# Patient Record
Sex: Female | Born: 1935 | Race: Black or African American | Hispanic: No
Health system: Southern US, Community
[De-identification: ages and names within clinical notes are randomized; demographics above are authoritative.]

---

## 2007-09-28 ENCOUNTER — Inpatient Hospital Stay: Payer: Self-pay | Admitting: Internal Medicine

## 2007-09-28 ENCOUNTER — Other Ambulatory Visit: Payer: Self-pay

## 2007-10-04 ENCOUNTER — Other Ambulatory Visit: Payer: Self-pay

## 2009-02-03 IMAGING — CR DG CHEST 1V PORT
1 series · 1 of 1 positions shown · non-contrast
Comparison: none

REASON FOR EXAM: Shortness of breath
COMMENTS:   LMP: Post-Menopausal

PROCEDURE:     DXR - DXR PORTABLE CHEST SINGLE VIEW  - September 28, 2007  [DATE]
RESULT:     The lungs are clear. The cardiovascular structures are
unremarkable.

[view not recorded]
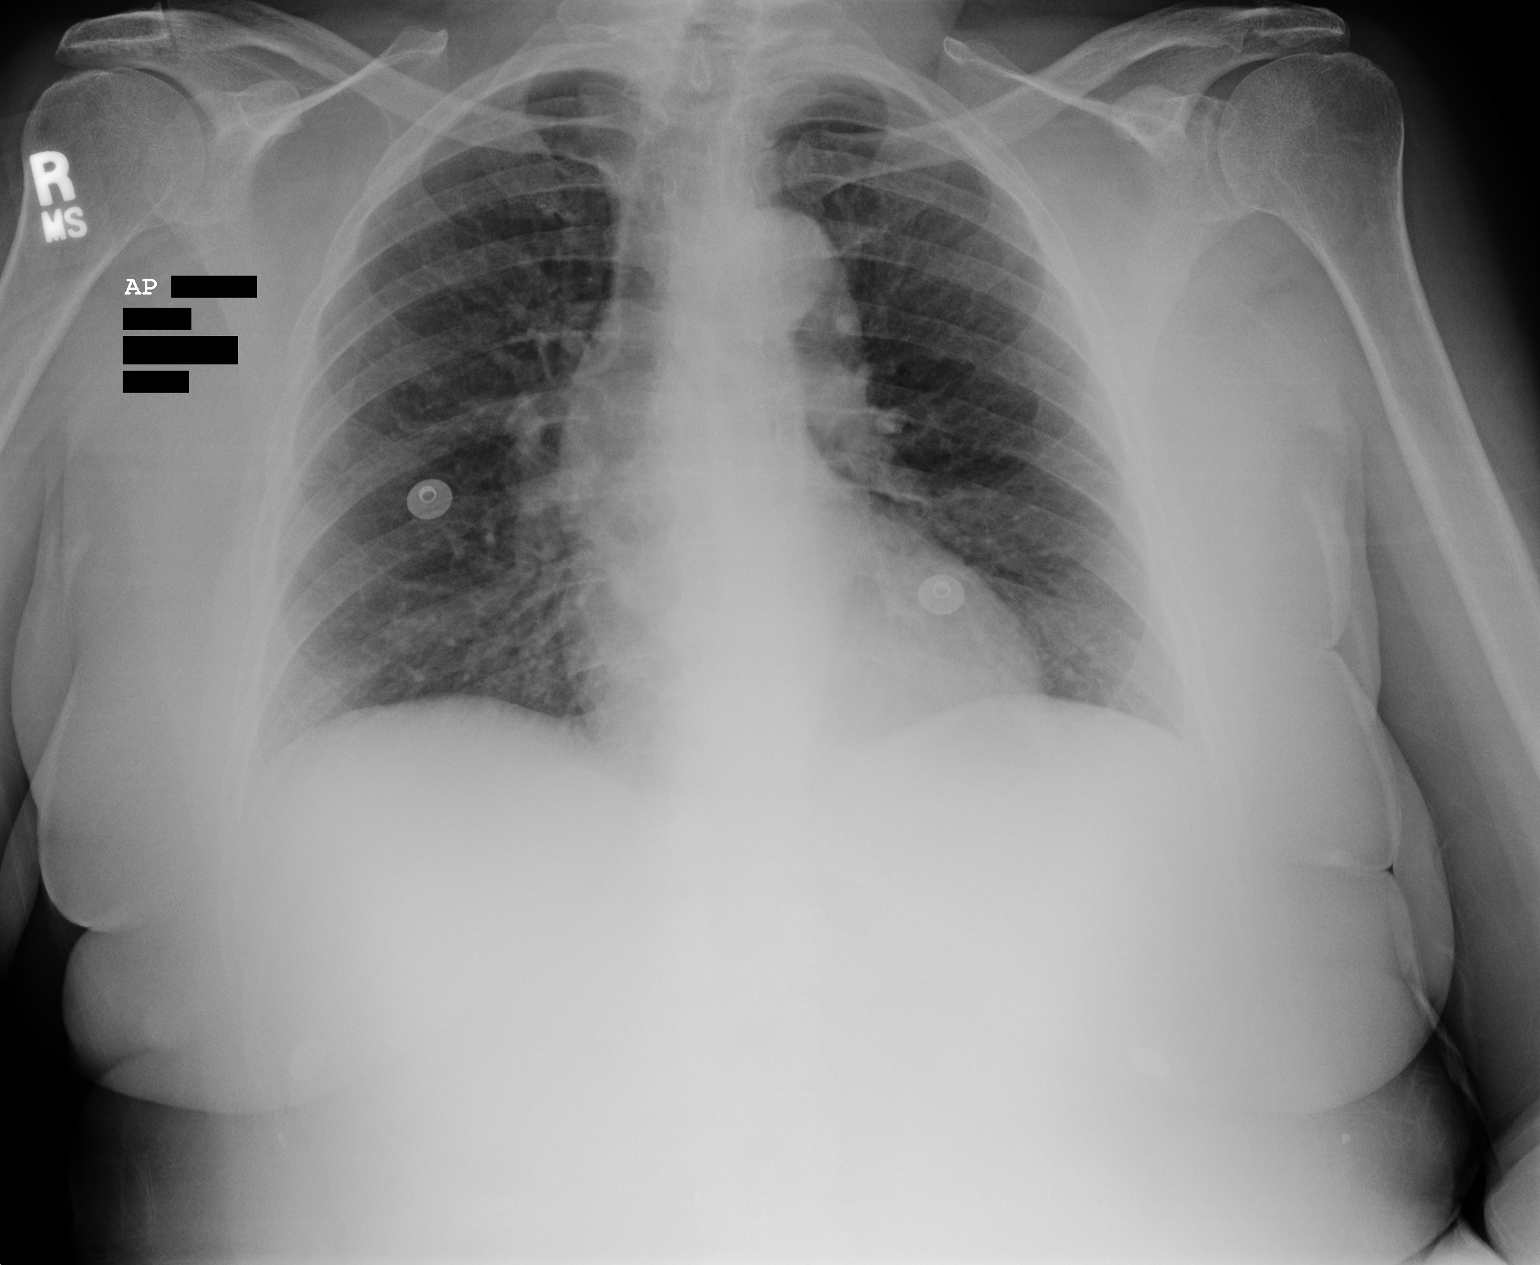

[1 of 1 positions shown; findings below may reference images not displayed]

IMPRESSION: No acute cardiopulmonary disease.

## 2009-02-06 IMAGING — CR DG CHEST 1V PORT
1 series · 1 of 1 positions shown · non-contrast
Comparison: none

REASON FOR EXAM: Shortness of breath
COMMENTS:

[view not recorded]
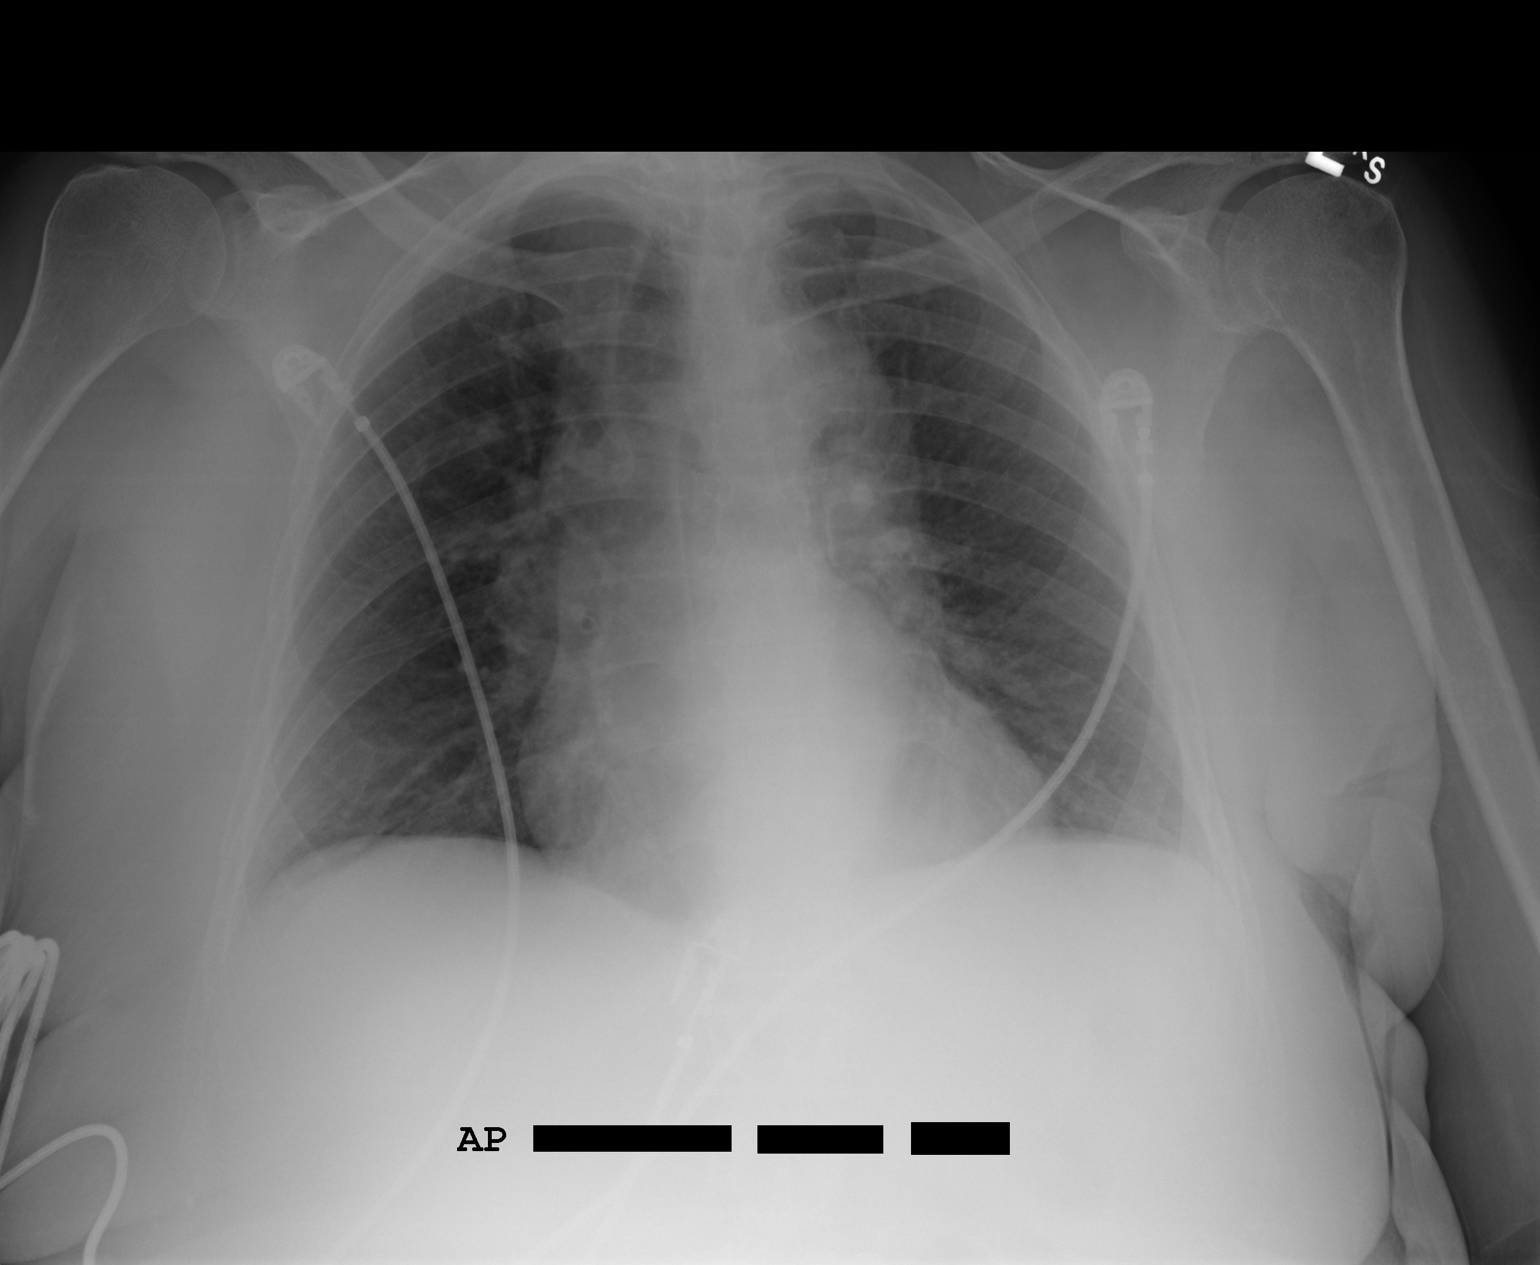

[1 of 1 positions shown; findings below may reference images not displayed]

PROCEDURE:     DXR - DXR PORTABLE CHEST SINGLE VIEW  - October 01, 2007  [DATE]

RESULT:     Comparison is made to the previous exam of 09/28/2007.

There is shallow inspiration. The cardiac silhouette is unremarkable.
Cardiac monitoring electrodes are present. There is no infiltrate, edema or
pneumothorax. There is no effusion.
IMPRESSION: Shallow inspiration. No acute cardiopulmonary disease
evident.

## 2015-07-23 DIAGNOSIS — E039 Hypothyroidism, unspecified: Secondary | ICD-10-CM | POA: Insufficient documentation

## 2015-08-13 DIAGNOSIS — N189 Chronic kidney disease, unspecified: Secondary | ICD-10-CM | POA: Insufficient documentation

## 2015-08-13 DIAGNOSIS — Z87440 Personal history of urinary (tract) infections: Secondary | ICD-10-CM | POA: Insufficient documentation

## 2015-11-20 DIAGNOSIS — M14679 Charcot's joint, unspecified ankle and foot: Secondary | ICD-10-CM | POA: Insufficient documentation

## 2015-11-20 DIAGNOSIS — M2010 Hallux valgus (acquired), unspecified foot: Secondary | ICD-10-CM | POA: Insufficient documentation

## 2017-06-21 DIAGNOSIS — M10079 Idiopathic gout, unspecified ankle and foot: Secondary | ICD-10-CM | POA: Insufficient documentation

## 2017-06-21 DIAGNOSIS — E785 Hyperlipidemia, unspecified: Secondary | ICD-10-CM | POA: Insufficient documentation

## 2017-06-21 DIAGNOSIS — G5793 Unspecified mononeuropathy of bilateral lower limbs: Secondary | ICD-10-CM | POA: Insufficient documentation

## 2018-09-13 DIAGNOSIS — K219 Gastro-esophageal reflux disease without esophagitis: Secondary | ICD-10-CM | POA: Insufficient documentation

## 2019-09-08 DIAGNOSIS — N185 Chronic kidney disease, stage 5: Secondary | ICD-10-CM | POA: Insufficient documentation

## 2020-02-13 DIAGNOSIS — N189 Chronic kidney disease, unspecified: Secondary | ICD-10-CM | POA: Insufficient documentation

## 2020-03-29 DIAGNOSIS — J441 Chronic obstructive pulmonary disease with (acute) exacerbation: Secondary | ICD-10-CM | POA: Insufficient documentation

## 2020-04-10 ENCOUNTER — Telehealth: Payer: Self-pay | Admitting: Primary Care

## 2020-04-10 NOTE — Telephone Encounter (Signed)
Spoke with patient's daughter, [place], regarding Palliative services and all questions were answered and she was in agreement with scheduling visit.  I have scheduled an In-person Consult for 04/14/20 @ 1:15 PM

## 2020-04-14 ENCOUNTER — Other Ambulatory Visit: Payer: Self-pay

## 2020-04-14 ENCOUNTER — Other Ambulatory Visit: Payer: Self-pay | Admitting: Primary Care

## 2020-04-14 DIAGNOSIS — Z515 Encounter for palliative care: Secondary | ICD-10-CM

## 2020-04-14 NOTE — Progress Notes (Signed)
[facility] Consult Note Telephone: [phone]  Fax: [phone]  PATIENT NAME: Taylor Barrett [address] [place] [address] [phone] (home)  DOB: 11-25-1927 MRN: [id number]  PRIMARY CARE PROVIDER:    Elaina Pattee, MD [place] [address] [place] [address]  REFERRING PROVIDER:   Dyke Brackett, MD [facility] [place],  [place] [address] [phone]  RESPONSIBLE PARTY:   Extended Emergency Contact Information Primary Emergency Contact: Barrett,ALBERTA Address: [address]          [place], [place] [address] Home Phone: [phone] Relation: None  I met face to face with patient and family in home.   ASSESSMENT AND RECOMMENDATIONS:   1. Advance Care Planning/Goals of Care: Goals include to maximize quality of life and symptom management. Our advance care planning conversation included a discussion about:     The value and importance of advance care planning   Experiences with loved ones who have been seriously ill or have died   Exploration of personal, cultural or spiritual beliefs that might influence medical decisions   Exploration of goals of care in the event of a sudden injury or illness   Identification of a healthcare agent   Review creation of an  advance directive document .  Had started working on MOST but thought it needed notarizing. I completed and left DNR as well in the home. I will upload to [facility]. Chose DNR, limited scope, use of abx, limited use of IV and feeding tube.   2. Symptom Management:   Fatigue: endorses some fatigue and is working with PT currently. I suggested PT train her Alvis Lemmings aids which are there 6 hrs / day in HEP.   Coordination of care: Pt is seen at [facility] and  Recently has decided to not pursue HD in light of stage 5 renal disease. This level of disease has been stable  For about a year. We will discuss possible trajectories of advancing  disease and what to expect.   3. Follow up Palliative Care Visit: Palliative care will continue to follow for goals of care clarification and symptom management. Return 6-8 weeks or prn.  4. Family /Caregiver/Community Supports: Lives with daughter in remote area,  has paid caregivers.   5. Cognitive / Functional decline:  A and o x 3, able to do some adls, ambulate with walker  I spent 75 minutes providing this consultation,  from 1600 to 1715. More than 50% of the time in this consultation was spent coordinating communication.   CHIEF COMPLAINT: weakness, care planning  HISTORY OF PRESENT ILLNESS:  Taylor Barrett is a 86 y.o. year old female with multiple medical problems including renal disease, hypothyroid, gout,  HLD, HTN, COPD. Palliative Care was asked to follow this patient by consultation request of Dyke Brackett, MD to help address advance care planning and goals of care. This is the initial visit.  CODE STATUS: DNR  PPS: 50%  HOSPICE ELIGIBILITY/DIAGNOSIS: TBD  PAST MEDICAL HISTORY:  Active Ambulatory Problems    Diagnosis Date Noted   Acquired hypothyroidism 07/23/2015   Anemia associated with chronic renal failure 08/13/2015   CKD, patient preferred treatment modality non-dialysis conservative care 02/13/2020   Chronic kidney disease, stage 5 ([place]) 09/08/2019   Charcot's joint of foot 11/20/2015   Gastroesophageal reflux disease without esophagitis 09/13/2018   COPD exacerbation ([place]) 03/29/2020   HLD (hyperlipidemia) 06/21/2017   Hallux valgus 11/20/2015   Idiopathic gout  of foot 06/21/2017   Neuropathy involving both lower extremities 06/21/2017   Personal history of urinary (tract) infections 08/13/2015   Resolved Ambulatory Problems    Diagnosis Date Noted   No Resolved Ambulatory Problems   No Additional Past Medical History    SOCIAL HX:  Social History   Tobacco Use   Smoking status: Not on file  Substance Use Topics   Alcohol  use: Not on file   FAMILY HX: No family history on file.  ALLERGIES:   No Known Allergies  PERTINENT MEDICATIONS:  Outpatient Encounter Medications as of 04/14/2020  Medication Sig   allopurinol (ZYLOPRIM) 100 MG tablet Take 50 mg by mouth daily as needed. Twice weekly   allopurinol (ZYLOPRIM) 100 MG tablet 1 tablet.   amLODipine (NORVASC) 2.5 MG tablet 1 tablet.   benzonatate (TESSALON) 100 MG capsule Take by mouth 3 (three) times daily as needed for cough.   carvedilol (COREG) 25 MG tablet 1 tablet.   estradiol (ESTRACE) 0.1 MG/GM vaginal cream 1 Applicatorful.   Fluticasone-Salmeterol (ADVAIR) 250-50 MCG/DOSE AEPB 1 puff.   gabapentin (NEURONTIN) 300 MG capsule 1 capsule.   levothyroxine (SYNTHROID) 50 MCG tablet 1 tablet.   magnesium oxide (MAG-OX) 400 MG tablet Take 1 tablet by mouth.   sertraline (ZOLOFT) 25 MG tablet 1 tablet.   No facility-administered encounter medications on file as of 04/14/2020.   PHYSICAL EXAM / ROS:   Current and past weights: unavailable General: NAD, frail appearing, thin Cardiovascular: no chest pain reported, no LE edema  Pulmonary: endorses cough, no increased SOB, room air Abdomen: appetite fair, denies constipation, continent of bowel GU: denies dysuria, endorses urine production, continent of urine MSK:  + joint and ROM abnormalities, ambulatory with walker Skin: no rashes or wounds reported Neurological: Weakness, fatigue, denies pain  Jason Coop, NP , DNP, MPH, [facility]  COVID-19 PATIENT SCREENING TOOL  Person answering questions: ___________Self____ _____   1.  Is the patient or any family member in the home showing any signs or symptoms regarding respiratory infection?               Person with Symptom- __________NA_________________  a. Fever                                                                          Yes___ No___          ___________________  b. Shortness of breath                                                     Yes___ No___          ___________________ c. Cough/congestion                                       Yes___  No___         ___________________ d. Body aches/pains  Yes___ No___        ____________________ e. Gastrointestinal symptoms (diarrhea, nausea)           Yes___ No___        ____________________  2. Within the past 14 days, has anyone living in the home had any contact with someone with or under investigation for COVID-19?    Yes___ No_X_   Person __________________

## 2020-06-02 ENCOUNTER — Other Ambulatory Visit: Payer: Self-pay | Admitting: Primary Care

## 2020-06-02 ENCOUNTER — Other Ambulatory Visit: Payer: Self-pay

## 2020-06-02 DIAGNOSIS — N189 Chronic kidney disease, unspecified: Secondary | ICD-10-CM

## 2020-06-02 DIAGNOSIS — Z515 Encounter for palliative care: Secondary | ICD-10-CM

## 2020-06-02 DIAGNOSIS — G5793 Unspecified mononeuropathy of bilateral lower limbs: Secondary | ICD-10-CM

## 2020-06-02 DIAGNOSIS — D631 Anemia in chronic kidney disease: Secondary | ICD-10-CM

## 2020-06-02 NOTE — Progress Notes (Signed)
[facility] Consult Note Telephone: [phone]  Fax: [phone]  PATIENT NAME: Taylor Barrett [address] [place] [address] [phone] (home)  DOB: 1928/08/09 MRN: [id number]  PRIMARY CARE PROVIDER:    Elaina Pattee, MD,  [place] [address] [place] [address] [phone]  REFERRING PROVIDER:   Dyke Brackett, MD [facility] [place],  [place] [address]  RESPONSIBLE PARTY:   Extended Emergency Contact Information Primary Emergency Contact: STEVENSON,ALBERTA Address: [address]          [place], [place] [address] Home Phone: [phone] Relation: None  I met face to face with patient and caregiver in her home.   ASSESSMENT AND RECOMMENDATIONS:   1. Advance Care Planning/Goals of Care: Goals include to maximize quality of life and symptom management. Our advance care planning conversation included a discussion about:     The value and importance of advance care planning   Experiences with loved ones who have been seriously ill or have died   Exploration of personal, cultural or spiritual beliefs that might influence medical decisions   Exploration of goals of care in the event of a sudden injury or illness   Identification  of a healthcare agent  - [place] (daughter) Review  of an  advance directive document - MOST form  2. Symptom Management:   Patient states she's feeling well. She has an apt in her daughter's home and caregivers during the day. She is  A and O x 3. Her days are filled with watching tv and visiting with family. She reiterates her decision to not do HE or PD with ESRD.  Recent labs not available as she's been doing mainly telemedicine since her trip to ED in August. She also reviews her directives for DNR and non invasive interventions (limited scope).  Pain: I recommend ATC acetaminophen CR to treat ongoing arthritic pain. She is currently taking 500 mg prn.  3.  Follow up Palliative Care Visit: Palliative care will continue to follow for goals of care clarification and symptom management. Return 12 weeks or prn.  4. Family /Caregiver/Community Supports: [place]  Has caregivers in home daily.    5. Cognitive / Functional decline: A and O x 3. Needs assistance with adls, iadls.Ambulates with walker in apartment. Has an elevator for egress.    I spent 60 minutes providing this consultation,  from 1300 to 1400. More than 50% of the time in this consultation was spent coordinating communication.   CHIEF COMPLAINT: weakness, moderate pain  HISTORY OF PRESENT ILLNESS:  Taylor Barrett is a 86 y.o. year old female  with ESRD and choice to not dialyze. She states some weakness and mild loss of stamina. She endorses arthritic pain in her right femur, from a remote fracture. She takes occasional acetaminophen with fair results. We are asked to consult around goals of care in the context of ESRD.    Review and summarization of old Epic records shows or history from other than patient  shows recent ESRD history and ED visit in August for hypoxia. Review or lab tests, radiology,  or medicine shows creatinine 3.3-3.0 in past several months.   Palliative Care was asked to follow this patient by consultation request of Dyke Brackett, MD to help address advance care planning and goals of care. This is a follow up visit.  CODE STATUS: DNR PPS: 60%  HOSPICE ELIGIBILITY/DIAGNOSIS: no  ROS  General: NAD EYES: denies vision changes, wears glasses ENMT: denies dysphagia Cardiovascular: denies chest pain, denies DOE Pulmonary: denies cough, denies increased SOB Abdomen: endorses fair appetite, endorses occ constipation,  endorses continence of bowel GU: denies dysuria, endorses continence of urine MSK:  endorses ROM limitations, endorses dizziness when walking, no falls reported Skin: denies rashes or wounds Neurological: endorses  weakness, some progression, endorses moderate pain, denies insomnia Psych: Endorses positive mood  Physical Exam: Current and past weights: 126 lbs, historically 128-126 lbs Constitutional: NAD General :frail appearing, thin EYES: anicteric sclera,EOMI, lids intact, no discharge  ENMT: intact hearing,oral mucous membranes moist, dentition missing CV: no LE edema Pulmonary:  no increased work of breathing, no cough, no audible wheezes, room air Abdomen: intake 25-50%, no ascites. GU: deferred MSK: moderate sacropenia, decreased ROM in all extremities, no contractures of LE, ambulatory with walker and assistance Skin: warm and dry, no rashes or wounds on visible skin Neuro: Weakness, no cognitive impairment, grossly non -focal Psych: relaxed affect, A and O x 3    CURRENT PROBLEM LIST:  Patient Active Problem List   Diagnosis Date Noted   COPD exacerbation ([place]) 03/29/2020   CKD, patient preferred treatment modality non-dialysis conservative care 02/13/2020   Chronic kidney disease, stage 5 ([place]) 09/08/2019   Gastroesophageal reflux disease without esophagitis 09/13/2018   HLD (hyperlipidemia) 06/21/2017   Idiopathic gout of foot 06/21/2017   Neuropathy involving both lower extremities 06/21/2017   Charcot's joint of foot 11/20/2015   Hallux valgus 11/20/2015   Anemia associated with chronic renal failure 08/13/2015   Personal history of urinary (tract) infections 08/13/2015   Acquired hypothyroidism 07/23/2015   PAST MEDICAL HISTORY: No past medical history on file.  SOCIAL HX:  Social History   Tobacco Use   Smoking status: Not on file  Substance Use Topics   Alcohol use: Not on file   FAMILY HX: No family history on file.  ALLERGIES: No Known Allergies   PERTINENT MEDICATIONS:  Outpatient Encounter Medications as of 06/02/2020  Medication Sig   allopurinol (ZYLOPRIM) 100 MG tablet Take 50 mg by mouth daily as needed. Twice weekly   allopurinol  (ZYLOPRIM) 100 MG tablet 1 tablet.   amLODipine (NORVASC) 2.5 MG tablet 1 tablet.   benzonatate (TESSALON) 100 MG capsule Take by mouth 3 (three) times daily as needed for cough.   carvedilol (COREG) 25 MG tablet 1 tablet.   estradiol (ESTRACE) 0.1 MG/GM vaginal cream 1 Applicatorful.   Fluticasone-Salmeterol (ADVAIR) 250-50 MCG/DOSE AEPB 1 puff.   gabapentin (NEURONTIN) 300 MG capsule 1 capsule.   levothyroxine (SYNTHROID) 50 MCG tablet 1 tablet.   magnesium oxide (MAG-OX) 400 MG tablet Take 1 tablet by mouth.   sertraline (ZOLOFT) 25 MG tablet 1 tablet.   No facility-administered encounter medications on file as of 06/02/2020.     Jason Coop, NP , DNP, MPH, AGPCNP-BC, ACHPN  COVID-19 PATIENT SCREENING TOOL  Person answering questions: ____________self______ _____   1.  Is the patient or any family member in the home showing any signs or symptoms regarding respiratory infection?               Person with Symptom- __________NA_________________  a. Fever  Yes___ No___          ___________________  b. Shortness of breath                                                    Yes___ No___          ___________________ c. Cough/congestion                                       Yes___  No___         ___________________ d. Body aches/pains                                                         Yes___ No___        ____________________ e. Gastrointestinal symptoms (diarrhea, nausea)           Yes___ No___        ____________________  2. Within the past 14 days, has anyone living in the home had any contact with someone with or under investigation for COVID-19?    Yes___ No_X_   Person __________________

## 2020-06-16 ENCOUNTER — Other Ambulatory Visit: Payer: Self-pay | Admitting: Primary Care

## 2020-08-14 ENCOUNTER — Other Ambulatory Visit: Payer: Self-pay | Admitting: Primary Care

## 2020-08-14 ENCOUNTER — Other Ambulatory Visit: Payer: Self-pay

## 2020-08-28 ENCOUNTER — Other Ambulatory Visit: Payer: Self-pay | Admitting: Primary Care

## 2020-09-01 ENCOUNTER — Other Ambulatory Visit: Payer: Self-pay | Admitting: Primary Care

## 2020-09-01 ENCOUNTER — Other Ambulatory Visit: Payer: Self-pay

## 2020-09-01 DIAGNOSIS — N189 Chronic kidney disease, unspecified: Secondary | ICD-10-CM

## 2020-09-01 DIAGNOSIS — D631 Anemia in chronic kidney disease: Secondary | ICD-10-CM

## 2020-09-01 DIAGNOSIS — G5793 Unspecified mononeuropathy of bilateral lower limbs: Secondary | ICD-10-CM

## 2020-09-01 DIAGNOSIS — Z515 Encounter for palliative care: Secondary | ICD-10-CM

## 2020-09-01 NOTE — Progress Notes (Signed)
[facility] Consult Note Telephone: [phone]  Fax: [phone]     Date of encounter: 09/01/20 PATIENT NAME: Taylor Barrett [address] [place] [address] [phone] (home)  DOB: [birth year] MRN: [id number]   [phone] Patient cell   PRIMARY CARE PROVIDER:    Elaina Pattee, MD,  [place] [address] [place] [address] [phone]  REFERRING PROVIDER:   Dyke Brackett, MD [facility] [place],  [place] [address] [phone]  RESPONSIBLE PARTY:   Extended Emergency Contact Information Primary Emergency Contact: STEVENSON,ALBERTA Address: [address]          [place], [place] [address] Home Phone: [phone] Relation: None   I met face to face with patient and family in  home. Palliative Care was asked to follow this patient by consultation request of Dr Bettey Costa to help address  advance care planning and goals of care. This is a follow up  visit.   ASSESSMENT AND RECOMMENDATIONS:   1. Advance Care Planning/Goals of Care: Goals include to maximize quality of life and symptom management.   I met with Taylor Barrett and her caregiver in her home. Her daughter was also in the home and stated that the house has been sold. Patient lives with another daughter and she and Patient are moving to [place] where they have a condominium in a [place] and [place] community.   Mrs. Gaspar Bidding is very excited about this move although the packing is overwhelming to her. Patient states that she has been doing well. She states that she eats well and does not have constipation. She think she will continue to follow with [facility] in [place] although she may look for a new PCP, as her current one works in [place].   She said she would continue with [facility] outpatient and I have let them know that she is relocating. Mrs. Gaspar Bidding did request continued community [employer], which will  need to be referred to another agency. She understands this.  Patient has very poor renal function but is elected no dialysis.   Today she appears more frail then on our previous visit. I had made several attempts to schedule a home visit  over the interceding months. She still however is alert and oriented and interactive, even in the face of more physical frailty. She has a Rollator she uses for ambulation and in her new home will have more access to living areas, as presently she's in an apartment over the garage. Last recorded weight 127 lbs but I feel it's less now. Recommend her weighing at next in person visit with clinician, for monitoring purposes.   I've reached out to [facility] outpatient Palliative to ask to refer to an agency which will serve her new area. Currently she does not have that address but I have updated phone numbers in her epic chart and they should be current for her and her daughter.  2. Symptom Management:   Renal function at baseline, has elected no dialysis. Eating and eminating within acceptable limits for goals.    3. Follow up Palliative Care Visit: Palliative care will continue to follow for goals of care clarification and symptom management. Refer to community palliative in [facility].  4. Family /Caregiver/Community Supports: Lives with family.  5. Cognitive / Functional decline: A and O x 3, able to ambulate. Needs assistance with most adls, iadls.  I spent 40 minutes providing this consultation,  from  1400 to 1440. More than 50% of the time in this consultation was spent coordinating communication.   CODE STATUS: DNR  PPS: 40%  HOSPICE ELIGIBILITY/DIAGNOSIS: TBD  Subjective:  CHIEF COMPLAINT: debility  HISTORY OF PRESENT ILLNESS:  Taylor Barrett is a 87 y.o. year old female  with debility, renal failure, fall risk .   We are asked to consult around advance care planning and complex medical decision making.  Review and summarization of old Epic records  shows or history from other than patient.Review of case with family member daughter.  History obtained from review of EMR, discussion with primary team, and  interview with family, caregiver  and/or Taylor Barrett. Records reviewed and summarized above.   CURRENT PROBLEM LIST:  Patient Active Problem List   Diagnosis Date Noted  . COPD exacerbation ([place]) 03/29/2020  . CKD, patient preferred treatment modality non-dialysis conservative care 02/13/2020  . Chronic kidney disease, stage 5 ([place]) 09/08/2019  . Gastroesophageal reflux disease without esophagitis 09/13/2018  . HLD (hyperlipidemia) 06/21/2017  . Idiopathic gout of foot 06/21/2017  . Neuropathy involving both lower extremities 06/21/2017  . Charcot's joint of foot 11/20/2015  . Hallux valgus 11/20/2015  . Anemia associated with chronic renal failure 08/13/2015  . Personal history of urinary (tract) infections 08/13/2015  . Acquired hypothyroidism 07/23/2015   PAST MEDICAL HISTORY:  Active Ambulatory Problems    Diagnosis Date Noted  . Acquired hypothyroidism 07/23/2015  . Anemia associated with chronic renal failure 08/13/2015  . CKD, patient preferred treatment modality non-dialysis conservative care 02/13/2020  . Chronic kidney disease, stage 5 ([place]) 09/08/2019  . Charcot's joint of foot 11/20/2015  . Gastroesophageal reflux disease without esophagitis 09/13/2018  . COPD exacerbation ([place]) 03/29/2020  . HLD (hyperlipidemia) 06/21/2017  . Hallux valgus 11/20/2015  . Idiopathic gout of foot 06/21/2017  . Neuropathy involving both lower extremities 06/21/2017  . Personal history of urinary (tract) infections 08/13/2015   Resolved Ambulatory Problems    Diagnosis Date Noted  . No Resolved Ambulatory Problems   No Additional Past Medical History   SOCIAL HX:  Social History   Tobacco Use  . Smoking status: Not on file  . Smokeless tobacco: Not on file  Substance Use Topics  . Alcohol use: Not on file   FAMILY HX: No  family history on file.    ALLERGIES: No Known Allergies   PERTINENT MEDICATIONS:  Outpatient Encounter Medications as of 09/01/2020  Medication Sig  . amLODipine (NORVASC) 2.5 MG tablet 1 tablet.  . carvedilol (COREG) 25 MG tablet 1 tablet.  . Fluticasone-Salmeterol (ADVAIR) 250-50 MCG/DOSE AEPB 1 puff.  . gabapentin (NEURONTIN) 300 MG capsule 1 capsule.  Marland Kitchen levothyroxine (SYNTHROID) 50 MCG tablet 1 tablet.  . magnesium oxide (MAG-OX) 400 MG tablet Take 1 tablet by mouth.  . sertraline (ZOLOFT) 25 MG tablet 1 tablet.  Marland Kitchen allopurinol (ZYLOPRIM) 100 MG tablet Take 50 mg by mouth daily as needed. Twice weekly (Patient not taking: Reported on 09/01/2020)  . allopurinol (ZYLOPRIM) 100 MG tablet 1 tablet. (Patient not taking: Reported on 09/01/2020)  . benzonatate (TESSALON) 100 MG capsule Take by mouth 3 (three) times daily as needed for cough. (Patient not taking: Reported on 09/01/2020)  . estradiol (ESTRACE) 0.1 MG/GM vaginal cream 1 Applicatorful. (Patient not taking: Reported on 09/01/2020)   No facility-administered encounter medications on file as of 09/01/2020.    Objective: ROS  deferred  Physical Exam: deferred  Thank you for the opportunity to participate in the care of Ms.  Marshell Barrett.  The palliative care team will continue to follow. Please call our office at [phone] if we can be of additional assistance.  Jason Coop, NP , DNP, MPH, AGPCNP-BC, ACHPN  COVID-19 PATIENT SCREENING TOOL  Person answering questions: ____________daughter_____   1.  Is the patient or any family member in the home showing any signs or symptoms regarding respiratory infection?               Person with Symptom- __________NA_________________  a. Fever                                                                          Yes___ No___          ___________________  b. Shortness of breath                                                    Yes___ No___           ___________________ c. Cough/congestion                                       Yes___  No___         ___________________ d. Body aches/pains                                                         Yes___ No___        ____________________ e. Gastrointestinal symptoms (diarrhea, nausea)           Yes___ No___        ____________________  2. Within the past 14 days, has anyone living in the home had any contact with someone with or under investigation for COVID-19?    Yes___ No_X_   Person __________________

## 2020-11-21 DEATH — deceased
